# Patient Record
Sex: Female | Born: 1990 | Race: Black or African American | Hispanic: No | Marital: Single | Smoking: Current every day smoker
Health system: Southern US, Community
[De-identification: ages and names within clinical notes are randomized; demographics above are authoritative.]

## PROBLEM LIST (undated history)

## (undated) HISTORY — PX: TONSILLECTOMY: SUR1361

---
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
## 2011-08-08 ENCOUNTER — Emergency Department: Payer: Self-pay | Admitting: Unknown Physician Specialty

## 2011-09-25 ENCOUNTER — Emergency Department: Payer: Self-pay | Admitting: Emergency Medicine

## 2011-12-06 ENCOUNTER — Observation Stay: Payer: Self-pay

## 2012-02-11 ENCOUNTER — Observation Stay: Payer: Self-pay | Admitting: Emergency Medicine

## 2012-02-22 ENCOUNTER — Inpatient Hospital Stay: Payer: Self-pay | Admitting: Obstetrics and Gynecology

## 2012-02-22 LAB — CBC WITH DIFFERENTIAL/PLATELET
Basophil #: 0 10*3/uL (ref 0.0–0.1)
Basophil %: 0 %
Eosinophil #: 0 10*3/uL (ref 0.0–0.7)
Eosinophil %: 0.3 %
HGB: 11.6 g/dL — ABNORMAL LOW (ref 12.0–16.0)
Lymphocyte #: 2 10*3/uL (ref 1.0–3.6)
Lymphocyte %: 18.8 %
MCHC: 33.2 g/dL (ref 32.0–36.0)
Monocyte %: 5.5 %
Neutrophil #: 8.1 10*3/uL — ABNORMAL HIGH (ref 1.4–6.5)
Neutrophil %: 75.4 %
Platelet: 102 10*3/uL — ABNORMAL LOW (ref 150–440)
RBC: 3.87 10*6/uL (ref 3.80–5.20)

## 2012-02-23 LAB — PIH PROFILE
Anion Gap: 13 (ref 7–16)
BUN: 4 mg/dL — ABNORMAL LOW (ref 7–18)
Calcium, Total: 9.1 mg/dL (ref 9.0–10.7)
Chloride: 106 mmol/L (ref 98–107)
EGFR (African American): >60
Glucose: 122 mg/dL — ABNORMAL HIGH (ref 65–99)
HCT: 35.5 % (ref 35.0–47.0)
HGB: 11.7 g/dL — ABNORMAL LOW (ref 12.0–16.0)
MCH: 30.1 pg (ref 26.0–34.0)
Osmolality: 285 (ref 275–301)
Platelet: 104 10*3/uL — ABNORMAL LOW (ref 150–440)
Potassium: 3.7 mmol/L (ref 3.5–5.1)
RBC: 3.9 10*6/uL (ref 3.80–5.20)
Sodium: 144 mmol/L (ref 136–145)
Uric Acid: 5.4 mg/dL (ref 3.0–5.8)
WBC: 17.3 10*3/uL — ABNORMAL HIGH (ref 3.6–11.0)

## 2012-02-23 LAB — CBC WITH DIFFERENTIAL/PLATELET
Basophil #: 0 10*3/uL (ref 0.0–0.1)
Eosinophil #: 0 10*3/uL (ref 0.0–0.7)
Lymphocyte #: 1.6 10*3/uL (ref 1.0–3.6)
MCH: 29.7 pg (ref 26.0–34.0)
MCHC: 32.9 g/dL (ref 32.0–36.0)
MCV: 90 fL (ref 80–100)
Monocyte #: 1.1 10*3/uL — ABNORMAL HIGH (ref 0.0–0.7)
Platelet: 105 10*3/uL — ABNORMAL LOW (ref 150–440)
RDW: 14.2 % (ref 11.5–14.5)
WBC: 19.8 10*3/uL — ABNORMAL HIGH (ref 3.6–11.0)

## 2012-02-24 LAB — HEMATOCRIT: HCT: 33.3 % — ABNORMAL LOW (ref 35.0–47.0)

## 2013-05-13 ENCOUNTER — Emergency Department: Payer: Self-pay | Admitting: Emergency Medicine

## 2014-08-19 ENCOUNTER — Emergency Department: Payer: Self-pay | Admitting: Emergency Medicine

## 2014-08-19 LAB — CBC WITH DIFFERENTIAL/PLATELET
BASOS ABS: 0.1 10*3/uL (ref 0.0–0.1)
Basophil %: 0.7 %
Eosinophil #: 0.2 10*3/uL (ref 0.0–0.7)
Eosinophil %: 1.6 %
HCT: 41.8 % (ref 35.0–47.0)
HGB: 13.5 g/dL (ref 12.0–16.0)
LYMPHS ABS: 1.9 10*3/uL (ref 1.0–3.6)
LYMPHS PCT: 15.3 %
MCH: 30.1 pg (ref 26.0–34.0)
MCHC: 32.4 g/dL (ref 32.0–36.0)
MCV: 93 fL (ref 80–100)
MONO ABS: 0.7 x10 3/mm (ref 0.2–0.9)
MONOS PCT: 5.7 %
NEUTROS PCT: 76.7 %
Neutrophil #: 9.4 10*3/uL — ABNORMAL HIGH (ref 1.4–6.5)
PLATELETS: 299 10*3/uL (ref 150–440)
RBC: 4.5 10*6/uL (ref 3.80–5.20)
RDW: 14.3 % (ref 11.5–14.5)
WBC: 12.2 10*3/uL — ABNORMAL HIGH (ref 3.6–11.0)

## 2014-08-19 LAB — COMPREHENSIVE METABOLIC PANEL
ALBUMIN: 3.1 g/dL — AB (ref 3.4–5.0)
Alkaline Phosphatase: 72 U/L
Anion Gap: 5 — ABNORMAL LOW (ref 7–16)
BILIRUBIN TOTAL: 0.7 mg/dL (ref 0.2–1.0)
BUN: 6 mg/dL — AB (ref 7–18)
CHLORIDE: 105 mmol/L (ref 98–107)
CREATININE: 1.12 mg/dL (ref 0.60–1.30)
Calcium, Total: 9.1 mg/dL (ref 8.5–10.1)
Co2: 27 mmol/L (ref 21–32)
EGFR (African American): >60
EGFR (Non-African Amer.): >60
Glucose: 95 mg/dL (ref 65–99)
OSMOLALITY: 271 (ref 275–301)
Potassium: 3.2 mmol/L — ABNORMAL LOW (ref 3.5–5.1)
SGOT(AST): 17 U/L (ref 15–37)
SGPT (ALT): 9 U/L — ABNORMAL LOW
Sodium: 137 mmol/L (ref 136–145)
Total Protein: 7.8 g/dL (ref 6.4–8.2)

## 2014-08-19 LAB — URINALYSIS, COMPLETE
Bilirubin,UR: NEGATIVE
Glucose,UR: NEGATIVE mg/dL (ref 0–75)
NITRITE: NEGATIVE
Ph: 6 (ref 4.5–8.0)
RBC,UR: 277 /HPF (ref 0–5)
Specific Gravity: 1.024 (ref 1.003–1.030)
Squamous Epithelial: 48
WBC UR: 152 /HPF (ref 0–5)

## 2014-08-19 LAB — LIPASE, BLOOD: Lipase: 111 U/L (ref 73–393)
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
## 2015-07-12 ENCOUNTER — Emergency Department
Admission: EM | Admit: 2015-07-12 | Discharge: 2015-07-12 | Disposition: A | Discharge status: Home / Self Care | Payer: Medicaid Other | Attending: Emergency Medicine | Admitting: Emergency Medicine

## 2015-07-12 ENCOUNTER — Encounter: Payer: Self-pay | Admitting: Emergency Medicine

## 2015-07-12 DIAGNOSIS — R358 Other polyuria: Secondary | ICD-10-CM | POA: Insufficient documentation

## 2015-07-12 DIAGNOSIS — Z3202 Encounter for pregnancy test, result negative: Secondary | ICD-10-CM | POA: Insufficient documentation

## 2015-07-12 DIAGNOSIS — N898 Other specified noninflammatory disorders of vagina: Secondary | ICD-10-CM | POA: Diagnosis not present

## 2015-07-12 DIAGNOSIS — Z720 Tobacco use: Secondary | ICD-10-CM | POA: Insufficient documentation

## 2015-07-12 DIAGNOSIS — Z880 Allergy status to penicillin: Secondary | ICD-10-CM | POA: Insufficient documentation

## 2015-07-12 DIAGNOSIS — R102 Pelvic and perineal pain: Secondary | ICD-10-CM | POA: Diagnosis not present

## 2015-07-12 LAB — WET PREP, GENITAL
Trich, Wet Prep: NONE SEEN
Yeast Wet Prep HPF POC: NONE SEEN

## 2015-07-12 LAB — URINALYSIS COMPLETE WITH MICROSCOPIC (ARMC ONLY)
BACTERIA UA: NONE SEEN
BILIRUBIN URINE: NEGATIVE
Glucose, UA: NEGATIVE mg/dL
HGB URINE DIPSTICK: NEGATIVE
Ketones, ur: NEGATIVE mg/dL
Leukocytes, UA: NEGATIVE
Nitrite: NEGATIVE
PH: 8 (ref 5.0–8.0)
PROTEIN: NEGATIVE mg/dL
Specific Gravity, Urine: 1.018 (ref 1.005–1.030)

## 2015-07-12 LAB — POCT PREGNANCY, URINE: Preg Test, Ur: NEGATIVE

## 2015-07-12 LAB — PREGNANCY, URINE: Preg Test, Ur: NEGATIVE

## 2015-07-12 LAB — CHLAMYDIA/NGC RT PCR (ARMC ONLY)
Chlamydia Tr: NOT DETECTED
N gonorrhoeae: DETECTED — AB

## 2015-07-12 MED ORDER — LIDOCAINE HCL (PF) 1 % IJ SOLN
0.9000 mL | Freq: Once | INTRAMUSCULAR | Status: AC
  Administered 2015-07-12: 0.9 mL

## 2015-07-12 MED ORDER — AZITHROMYCIN 250 MG PO TABS
ORAL_TABLET | ORAL | Status: AC
  Administered 2015-07-12: 1000 mg via ORAL
  Filled 2015-07-12: qty 4

## 2015-07-12 MED ORDER — CEFTRIAXONE SODIUM 250 MG IJ SOLR
INTRAMUSCULAR | Status: AC
  Administered 2015-07-12: 250 mg via INTRAMUSCULAR
  Filled 2015-07-12: qty 250

## 2015-07-12 MED ORDER — LIDOCAINE HCL (PF) 1 % IJ SOLN
INTRAMUSCULAR | Status: AC
  Administered 2015-07-12: 0.9 mL
  Filled 2015-07-12: qty 5

## 2015-07-12 MED ORDER — AZITHROMYCIN 250 MG PO TABS
1000.0000 mg | ORAL_TABLET | Freq: Once | ORAL | Status: AC
  Administered 2015-07-12: 1000 mg via ORAL

## 2015-07-12 MED ORDER — CEFTRIAXONE SODIUM 250 MG IJ SOLR
250.0000 mg | INTRAMUSCULAR | Status: DC
  Administered 2015-07-12: 250 mg via INTRAMUSCULAR
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
## 2015-07-12 NOTE — ED Notes (Addendum)
POC preg negative.

## 2015-07-12 NOTE — ED Notes (Signed)
Pelvic pain and urinary frequency. Recently dx with uti but states she didn't get her antibiotic filled bc she didn't have money ".  States "now its worse"
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
## 2016-04-29 ENCOUNTER — Emergency Department
Admission: EM | Admit: 2016-04-29 | Discharge: 2016-04-29 | Disposition: A | Discharge status: Home / Self Care | Payer: Medicaid Other | Attending: Emergency Medicine | Admitting: Emergency Medicine

## 2016-04-29 ENCOUNTER — Encounter: Payer: Self-pay | Admitting: Emergency Medicine

## 2016-04-29 DIAGNOSIS — N939 Abnormal uterine and vaginal bleeding, unspecified: Secondary | ICD-10-CM | POA: Diagnosis present

## 2016-04-29 DIAGNOSIS — F17210 Nicotine dependence, cigarettes, uncomplicated: Secondary | ICD-10-CM | POA: Diagnosis not present

## 2016-04-29 LAB — HCG, QUANTITATIVE, PREGNANCY: hCG, Beta Chain, Quant, S: <1 m[IU]/mL (ref <5)

## 2016-04-29 LAB — POCT PREGNANCY, URINE: PREG TEST UR: NEGATIVE

## 2016-04-29 NOTE — ED Notes (Addendum)
Pt to ed with c/o LMP on March 15th.,  Had positive pregnancy test and then started with vaginal bleeding on April 25th and very light bleeding now.  Pt states she was trying to get pregnant.  Pt reports abd pain and cramping with the vaginal bleeding.

## 2016-04-29 NOTE — ED Notes (Signed)
Pt had a positive pregnancy test 2 weeks ago, pt started having vaginal bleeding with cramping two days ago, pt is currently spotting blood today

## 2016-04-29 NOTE — Discharge Instructions (Signed)
As we discussed, your pregnancy tests (both urine and blood) are negative today.  We do not know if your pregnancy was just so early that you had a spontaneous miscarriage before the blood test could turn positive, or perhaps you had a false positive pregnancy test at home.  Either way, there is no other treatment needed at this time.  We recommend you follow-up as scheduled tomorrow since you declined a pelvic exam today; you should have STD testing either with [facility] or [facility] at the next available opportunity.
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
## 2016-08-11 ENCOUNTER — Emergency Department: Payer: Medicaid Other

## 2016-08-11 ENCOUNTER — Encounter: Payer: Self-pay | Admitting: *Deleted

## 2016-08-11 ENCOUNTER — Emergency Department
Admission: EM | Admit: 2016-08-11 | Discharge: 2016-08-11 | Disposition: A | Discharge status: Home / Self Care | Payer: Medicaid Other | Attending: Emergency Medicine | Admitting: Emergency Medicine

## 2016-08-11 DIAGNOSIS — Y999 Unspecified external cause status: Secondary | ICD-10-CM | POA: Diagnosis not present

## 2016-08-11 DIAGNOSIS — Y939 Activity, unspecified: Secondary | ICD-10-CM | POA: Diagnosis not present

## 2016-08-11 DIAGNOSIS — R51 Headache: Secondary | ICD-10-CM | POA: Diagnosis present

## 2016-08-11 DIAGNOSIS — F17210 Nicotine dependence, cigarettes, uncomplicated: Secondary | ICD-10-CM | POA: Diagnosis not present

## 2016-08-11 DIAGNOSIS — Y92410 Unspecified street and highway as the place of occurrence of the external cause: Secondary | ICD-10-CM | POA: Diagnosis not present

## 2016-08-11 DIAGNOSIS — S0083XA Contusion of other part of head, initial encounter: Secondary | ICD-10-CM | POA: Diagnosis not present

## 2016-08-11 LAB — POCT PREGNANCY, URINE: PREG TEST UR: NEGATIVE

## 2016-08-11 MED ORDER — CYCLOBENZAPRINE HCL 10 MG PO TABS: 10.0000 mg | ORAL_TABLET | Freq: Three times a day (TID) | ORAL | 0 refills | Status: DC | PRN

## 2016-08-11 MED ORDER — IBUPROFEN 800 MG PO TABS: 800.0000 mg | ORAL_TABLET | Freq: Three times a day (TID) | ORAL | 0 refills | Status: DC | PRN
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
## 2017-01-06 LAB — HM HIV SCREENING LAB: HM HIV Screening: NEGATIVE

## 2017-10-01 ENCOUNTER — Encounter: Payer: Self-pay | Admitting: Emergency Medicine

## 2017-10-01 ENCOUNTER — Emergency Department
Admission: EM | Admit: 2017-10-01 | Discharge: 2017-10-01 | Disposition: A | Discharge status: Home / Self Care | Payer: No Typology Code available for payment source | Attending: Emergency Medicine | Admitting: Emergency Medicine

## 2017-10-01 DIAGNOSIS — Y999 Unspecified external cause status: Secondary | ICD-10-CM | POA: Insufficient documentation

## 2017-10-01 DIAGNOSIS — F17210 Nicotine dependence, cigarettes, uncomplicated: Secondary | ICD-10-CM | POA: Insufficient documentation

## 2017-10-01 DIAGNOSIS — Y92410 Unspecified street and highway as the place of occurrence of the external cause: Secondary | ICD-10-CM | POA: Diagnosis not present

## 2017-10-01 DIAGNOSIS — M542 Cervicalgia: Secondary | ICD-10-CM | POA: Diagnosis not present

## 2017-10-01 DIAGNOSIS — Y9389 Activity, other specified: Secondary | ICD-10-CM | POA: Diagnosis not present

## 2017-10-01 LAB — POCT PREGNANCY, URINE: Preg Test, Ur: NEGATIVE

## 2017-10-01 MED ORDER — NAPROXEN 500 MG PO TABS
500.0000 mg | ORAL_TABLET | Freq: Once | ORAL | Status: AC
  Administered 2017-10-01: 500 mg via ORAL
  Filled 2017-10-01: qty 1

## 2017-10-01 MED ORDER — CYCLOBENZAPRINE HCL 10 MG PO TABS
5.0000 mg | ORAL_TABLET | Freq: Once | ORAL | Status: AC
  Administered 2017-10-01: 5 mg via ORAL
  Filled 2017-10-01: qty 1

## 2017-10-01 MED ORDER — CYCLOBENZAPRINE HCL 5 MG PO TABS: 5.0000 mg | ORAL_TABLET | Freq: Three times a day (TID) | ORAL | 0 refills | Status: DC | PRN

## 2017-10-01 NOTE — Discharge Instructions (Signed)
Your exam is consistent with neck muscle strain. Take the prescription muscle relaxant as needed. Take OTC ibuprofen as needed for non-drowsy pain relief. Follow-up with your provider for continued symptoms.
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
## 2017-10-24 ENCOUNTER — Encounter: Payer: Self-pay | Admitting: Emergency Medicine

## 2017-10-24 ENCOUNTER — Emergency Department: Payer: Medicaid Other

## 2017-10-24 ENCOUNTER — Emergency Department
Admission: EM | Admit: 2017-10-24 | Discharge: 2017-10-24 | Disposition: A | Discharge status: Home / Self Care | Payer: Medicaid Other | Attending: Emergency Medicine | Admitting: Emergency Medicine

## 2017-10-24 DIAGNOSIS — N7011 Chronic salpingitis: Secondary | ICD-10-CM | POA: Insufficient documentation

## 2017-10-24 DIAGNOSIS — N739 Female pelvic inflammatory disease, unspecified: Secondary | ICD-10-CM

## 2017-10-24 DIAGNOSIS — F17210 Nicotine dependence, cigarettes, uncomplicated: Secondary | ICD-10-CM | POA: Insufficient documentation

## 2017-10-24 DIAGNOSIS — R1030 Lower abdominal pain, unspecified: Secondary | ICD-10-CM | POA: Diagnosis present

## 2017-10-24 LAB — COMPREHENSIVE METABOLIC PANEL
ALK PHOS: 62 U/L (ref 38–126)
ALT: 14 U/L (ref 14–54)
AST: 20 U/L (ref 15–41)
Albumin: 4.3 g/dL (ref 3.5–5.0)
Anion gap: 9 (ref 5–15)
BUN: 10 mg/dL (ref 6–20)
CALCIUM: 9.5 mg/dL (ref 8.9–10.3)
CO2: 26 mmol/L (ref 22–32)
CREATININE: 1.1 mg/dL — AB (ref 0.44–1.00)
Chloride: 102 mmol/L (ref 101–111)
Glucose, Bld: 113 mg/dL — ABNORMAL HIGH (ref 65–99)
Potassium: 4 mmol/L (ref 3.5–5.1)
Sodium: 137 mmol/L (ref 135–145)
TOTAL PROTEIN: 8 g/dL (ref 6.5–8.1)
Total Bilirubin: 0.8 mg/dL (ref 0.3–1.2)

## 2017-10-24 LAB — CHLAMYDIA/NGC RT PCR (ARMC ONLY)
CHLAMYDIA TR: NOT DETECTED
N GONORRHOEAE: DETECTED — AB

## 2017-10-24 LAB — CBC
HCT: 41.8 % (ref 35.0–47.0)
HEMOGLOBIN: 13.7 g/dL (ref 12.0–16.0)
MCH: 30.3 pg (ref 26.0–34.0)
MCHC: 32.9 g/dL (ref 32.0–36.0)
MCV: 92.1 fL (ref 80.0–100.0)
Platelets: 190 10*3/uL (ref 150–440)
RBC: 4.53 MIL/uL (ref 3.80–5.20)
RDW: 14.7 % — ABNORMAL HIGH (ref 11.5–14.5)
WBC: 18.4 10*3/uL — ABNORMAL HIGH (ref 3.6–11.0)

## 2017-10-24 LAB — URINALYSIS, COMPLETE (UACMP) WITH MICROSCOPIC
BILIRUBIN URINE: NEGATIVE
Bacteria, UA: NONE SEEN
Glucose, UA: NEGATIVE mg/dL
HGB URINE DIPSTICK: NEGATIVE
Ketones, ur: NEGATIVE mg/dL
Leukocytes, UA: NEGATIVE
NITRITE: NEGATIVE
PROTEIN: NEGATIVE mg/dL
Specific Gravity, Urine: 1.02 (ref 1.005–1.030)
pH: 7 (ref 5.0–8.0)

## 2017-10-24 LAB — LIPASE, BLOOD: Lipase: 19 U/L (ref 11–51)

## 2017-10-24 LAB — WET PREP, GENITAL
Clue Cells Wet Prep HPF POC: NONE SEEN
Sperm: NONE SEEN
Trich, Wet Prep: NONE SEEN
Yeast Wet Prep HPF POC: NONE SEEN

## 2017-10-24 LAB — POCT PREGNANCY, URINE: PREG TEST UR: NEGATIVE

## 2017-10-24 MED ORDER — MORPHINE SULFATE (PF) 4 MG/ML IV SOLN
INTRAVENOUS | Status: AC
  Administered 2017-10-24: 4 mg via INTRAVENOUS
  Filled 2017-10-24: qty 1

## 2017-10-24 MED ORDER — SODIUM CHLORIDE 0.9 % IV BOLUS (SEPSIS)
1000.0000 mL | Freq: Once | INTRAVENOUS | Status: AC
  Administered 2017-10-24: 1000 mL via INTRAVENOUS

## 2017-10-24 MED ORDER — LEVOFLOXACIN 500 MG PO TABS
500.0000 mg | ORAL_TABLET | Freq: Once | ORAL | Status: AC
  Administered 2017-10-24: 500 mg via ORAL
  Filled 2017-10-24: qty 1

## 2017-10-24 MED ORDER — METRONIDAZOLE 500 MG PO TABS
500.0000 mg | ORAL_TABLET | Freq: Once | ORAL | Status: AC
  Administered 2017-10-24: 500 mg via ORAL
  Filled 2017-10-24: qty 1

## 2017-10-24 MED ORDER — IOPAMIDOL (ISOVUE-300) INJECTION 61%
30.0000 mL | Freq: Once | INTRAVENOUS | Status: AC | PRN
  Administered 2017-10-24: 30 mL via ORAL

## 2017-10-24 MED ORDER — LEVOFLOXACIN 500 MG PO TABS: 500.0000 mg | ORAL_TABLET | Freq: Every day | ORAL | 0 refills | Status: AC

## 2017-10-24 MED ORDER — MORPHINE SULFATE (PF) 4 MG/ML IV SOLN
4.0000 mg | Freq: Once | INTRAVENOUS | Status: AC
  Administered 2017-10-24: 4 mg via INTRAVENOUS

## 2017-10-24 MED ORDER — METRONIDAZOLE 500 MG PO TABS: 500.0000 mg | ORAL_TABLET | Freq: Two times a day (BID) | ORAL | 0 refills | Status: AC

## 2017-10-24 MED ORDER — ONDANSETRON HCL 4 MG/2ML IJ SOLN
INTRAMUSCULAR | Status: AC
  Administered 2017-10-24: 4 mg via INTRAVENOUS
  Filled 2017-10-24: qty 2

## 2017-10-24 MED ORDER — ONDANSETRON HCL 4 MG/2ML IJ SOLN
4.0000 mg | Freq: Once | INTRAMUSCULAR | Status: AC
  Administered 2017-10-24: 4 mg via INTRAVENOUS

## 2017-10-24 MED ORDER — IOPAMIDOL (ISOVUE-300) INJECTION 61%
100.0000 mL | Freq: Once | INTRAVENOUS | Status: AC | PRN
  Administered 2017-10-24: 100 mL via INTRAVENOUS

## 2017-10-24 NOTE — ED Triage Notes (Signed)
Pt c/o mid lower abdominal pain that radiates to right side of abdomen. Started last night. No vomiting but has been nauseated.  Did have temp 101 per pt but went away on own.  VSS.  No distress currently.  Ambulatory to triage.

## 2017-10-24 NOTE — ED Notes (Signed)
Pt. Indicated she finished contrast, ct called.

## 2017-10-24 NOTE — ED Provider Notes (Signed)
[facility] Emergency Department Provider Note ____________________________________________   First MD Initiated Contact with Patient 10/24/17 1839     (approximate)  I have reviewed the triage vital signs and the nursing notes.   HISTORY  Chief Complaint Abdominal Pain    HPI Melanie Duran is a 26 y.o. female with no significant past medical history who presents with right lower abdominal pain since last night, acute onset, persistent course, associated with nausea but no vomiting, as well as fever.  No associated diarrhea.  Patient reports last bowel movement earlier today which was normal.  She denies vaginal bleeding or discharge, and states she is sexually active with one partner and has no STI history.   History reviewed. No pertinent past medical history.  There are no active problems to display for this patient.   Past Surgical History:  Procedure Laterality Date  . TONSILLECTOMY      Prior to Admission medications   Medication Sig Start Date End Date Taking? Authorizing Provider  cyclobenzaprine (FLEXERIL) 5 MG tablet Take 1 tablet (5 mg total) by mouth 3 (three) times daily as needed for muscle spasms. 10/01/17   Menshew, Charlesetta Ivory, PA-C  ibuprofen (ADVIL,MOTRIN) 800 MG tablet Take 1 tablet (800 mg total) by mouth every 8 (eight) hours as needed. 08/11/16   Beers, Charmayne Sheer, PA-C    Allergies Amoxicillin  History reviewed. No pertinent family history.  Social History Social History  Substance Use Topics  . Smoking status: Current Every Day Smoker    Packs/day: 0.50    Types: Cigarettes  . Smokeless tobacco: Never Used  . Alcohol use Yes    Review of Systems  Constitutional: Positive for fever Eyes: No redness. ENT: No neck pain. Cardiovascular: Denies chest pain. Respiratory: Denies shortness of breath. Gastrointestinal: Positive for nausea Genitourinary: Negative for dysuria.  Musculoskeletal: Negative for back  pain. Skin: Negative for rash. Neurological: Negative for headache.    ____________________________________________   PHYSICAL EXAM:  VITAL SIGNS: ED Triage Vitals  Enc Vitals Group     BP 10/24/17 1551 125/61     Pulse Rate 10/24/17 1551 80     Resp 10/24/17 1551 18     Temp 10/24/17 1551 99.2 F (37.3 C)     Temp Source 10/24/17 1551 Oral     SpO2 10/24/17 1551 100 %     Weight 10/24/17 1552 175 lb (79.4 kg)     Height 10/24/17 1552 5\' 3"  (1.6 m)     Head Circumference --      Peak Flow --      Pain Score 10/24/17 1551 10     Pain Loc --      Pain Edu? --      Excl. in GC? --     Constitutional: Alert and oriented. Well appearing and in no acute distress. Eyes: Conjunctivae are normal.  Head: Atraumatic. Nose: No congestion/rhinnorhea. Mouth/Throat: Mucous membranes are moist.   Neck: Normal range of motion.  Cardiovascular:   Good peripheral circulation. Respiratory: Normal respiratory effort.   Gastrointestinal: Soft with mild midline suprapubic and moderate right lower quadrant tenderness to palpation.  No distention. Genitourinary: No CVA tenderness. Musculoskeletal: No lower extremity edema.  Extremities warm and well perfused.  Neurologic:  Normal speech and language. No gross focal neurologic deficits are appreciated.  Skin:  Skin is warm and dry. No rash noted. Psychiatric: Mood and affect are normal. Speech and behavior are normal.  ____________________________________________   LABS (all  labs ordered are listed, but only abnormal results are displayed)  Labs Reviewed  WET PREP, GENITAL - Abnormal; Notable for the following:       Result Value   WBC, Wet Prep HPF POC MANY (*)    All other components within normal limits  COMPREHENSIVE METABOLIC PANEL - Abnormal; Notable for the following:    Glucose, Bld 113 (*)    Creatinine, Ser 1.10 (*)    All other components within normal limits  CBC - Abnormal; Notable for the following:    WBC 18.4 (*)     RDW 14.7 (*)    All other components within normal limits  URINALYSIS, COMPLETE (UACMP) WITH MICROSCOPIC - Abnormal; Notable for the following:    Color, Urine YELLOW (*)    APPearance CLEAR (*)    Squamous Epithelial / LPF 6-30 (*)    All other components within normal limits  CHLAMYDIA/NGC RT PCR ([facility] ONLY)  LIPASE, BLOOD  POC URINE PREG, ED  POCT PREGNANCY, URINE   ____________________________________________  EKG   ____________________________________________  RADIOLOGY  CT abd: R large and L small hydro/pylosalpinx, consistent with PID.  Normal appendix.   ____________________________________________   PROCEDURES  Procedure(s) performed: No    Critical Care performed: No ____________________________________________   INITIAL IMPRESSION / ASSESSMENT AND PLAN / ED COURSE  Pertinent labs & imaging results that were available during my care of the patient were reviewed by me and considered in my medical decision making (see chart for details).  26-year-old female with no significant past medical history presents with 1 day of right lower quadrant abdominal pain associated with nausea and fever.  On exam, patient's temperature is slightly elevated, and she is tender in the right lower quadrant.  Of exam deferred due to lack of gynecologic symptoms and presence of fever and white blood cell count.  Differential includes appendicitis, colitis, mesenteric adenitis, less likely acute gynecologic cause.  Given patient's well appearance and stable vital signs do not suspect torsion.  Plan: CT abdomen.  If no acute findings, consider gynecologic workup with ultrasound.    ----------------------------------------- 9:07 PM on 10/24/2017 -----------------------------------------  CT reveals bilateral right greater than left hydro-/pyosalpinx, and findings consistent with PID.  Pelvic performed which confirms small amount of discharge but no other acute findings.  I  consulted Dr. Elesa MassedWard from OB/GYN to discuss management.  Based on patient's normal vital signs, well appearance, the CT findings, and the fact she is not pregnant, per Dr. Elesa MassedWard, patient is appropriate for outpatient management.  Recommends outpatient treatment for PID and follow-up in 2 weeks for ultrasound and reassessment.  Patient feels comfortable with the plan and would like to go home.  I gave return precautions including but not limited to new or worsening pain, persistent pain, or persistent fever despite antibiotics.  Patient also instructed to have her sexual partner tested.   She reports an amoxicillin allergy which is consistent with anaphylaxis ("my throat closes"), so she is not appropriate for therapy with ceftriaxone.  Based on CDC recommendations for alternative regimens, we will discharge with levofloxacin and Flagyl and give first dose here.    ____________________________________________   FINAL CLINICAL IMPRESSION(S) / ED DIAGNOSES  Final diagnoses:  Pelvic inflammatory disease  Hydrosalpinx      NEW MEDICATIONS STARTED DURING THIS VISIT:  New Prescriptions   No medications on file     Note:  This document was prepared using Dragon voice recognition software and may include unintentional dictation errors.  Dionne Bucy, MD 10/24/17 2111

## 2017-10-24 NOTE — ED Notes (Signed)
Pt going home with family

## 2017-10-24 NOTE — ED Notes (Signed)
Patient transported to CT 

## 2017-10-24 NOTE — Discharge Instructions (Signed)
Take the antibiotics as prescribed starting tomorrow and finish the full course.  Follow-up with Dr. Elesa MassedWard in 2 weeks from Monday, and when you call to arrange the appointment you need to specifically state that you will need an ultrasound at that appointment.  In the meantime, return to the ER immediately if you have new or worsening pain, persistent pain, or persistent fever or weakness despite being on antibiotics, as well as for any other new or worsening symptoms that concern you.

## 2017-10-26 ENCOUNTER — Telehealth: Payer: Self-pay | Admitting: Emergency Medicine

## 2017-10-26 NOTE — Telephone Encounter (Addendum)
Called patient to notify of lab results and check on condition.  Voicemail is not available and she did not answer.  4pm--still unable to contact by phone.  Will send letter.   11/5-finally got in touch with patient.  She says she is doing well.  Explained results of std testing.  Has not followed up with dr ward yet, but will call for appt now.  Says partner was treated as well.

## 2018-04-02 ENCOUNTER — Other Ambulatory Visit: Payer: Self-pay

## 2018-04-02 ENCOUNTER — Telehealth (HOSPITAL_COMMUNITY): Payer: Self-pay

## 2018-04-02 ENCOUNTER — Ambulatory Visit
Admission: EM | Admit: 2018-04-02 | Discharge: 2018-04-02 | Disposition: A | Discharge status: Home / Self Care | Payer: Medicaid Other | Attending: Family Medicine | Admitting: Family Medicine

## 2018-04-02 DIAGNOSIS — F17210 Nicotine dependence, cigarettes, uncomplicated: Secondary | ICD-10-CM | POA: Diagnosis not present

## 2018-04-02 DIAGNOSIS — M545 Low back pain: Secondary | ICD-10-CM | POA: Diagnosis present

## 2018-04-02 DIAGNOSIS — A599 Trichomoniasis, unspecified: Secondary | ICD-10-CM | POA: Diagnosis not present

## 2018-04-02 DIAGNOSIS — N730 Acute parametritis and pelvic cellulitis: Secondary | ICD-10-CM | POA: Diagnosis not present

## 2018-04-02 DIAGNOSIS — R1030 Lower abdominal pain, unspecified: Secondary | ICD-10-CM | POA: Diagnosis present

## 2018-04-02 LAB — URINALYSIS, COMPLETE (UACMP) WITH MICROSCOPIC
Bacteria, UA: NONE SEEN
GLUCOSE, UA: NEGATIVE mg/dL
Hgb urine dipstick: NEGATIVE
KETONES UR: NEGATIVE mg/dL
Leukocytes, UA: NEGATIVE
NITRITE: NEGATIVE
PH: 6 (ref 5.0–8.0)
RBC / HPF: NONE SEEN RBC/hpf (ref 0–5)

## 2018-04-02 LAB — CHLAMYDIA/NGC RT PCR (ARMC ONLY)
CHLAMYDIA TR: NOT DETECTED
N GONORRHOEAE: NOT DETECTED

## 2018-04-02 LAB — WET PREP, GENITAL
CLUE CELLS WET PREP: NONE SEEN
Sperm: NONE SEEN
YEAST WET PREP: NONE SEEN

## 2018-04-02 LAB — PREGNANCY, URINE: Preg Test, Ur: NEGATIVE

## 2018-04-02 MED ORDER — LEVOFLOXACIN 500 MG PO TABS: 500.0000 mg | ORAL_TABLET | Freq: Every day | ORAL | 0 refills | Status: DC

## 2018-04-02 MED ORDER — METRONIDAZOLE 500 MG PO TABS: 2000.0000 mg | ORAL_TABLET | Freq: Once | ORAL | 0 refills | Status: AC

## 2018-04-02 MED ORDER — AZITHROMYCIN 250 MG PO TABS: 2000.0000 mg | ORAL_TABLET | Freq: Once | ORAL | 0 refills | Status: AC

## 2018-04-02 MED ORDER — AZITHROMYCIN 500 MG PO TABS: 2000.0000 mg | ORAL_TABLET | Freq: Once | ORAL | Status: DC

## 2018-04-02 NOTE — Telephone Encounter (Signed)
Attempted to contact patient regarding results from recent visit and to advise to stop taking Levaquin and Azithromycin. No answer at this time. Voicemail left.

## 2018-04-02 NOTE — Discharge Instructions (Addendum)
Medications as prescribed. ° °Take care ° °Dr. Francille Wittmann  °
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
## 2018-04-02 NOTE — ED Provider Notes (Signed)
[facility]   CSN: [id number] Arrival date & time: 04/02/18  0808  History   Chief Complaint Chief Complaint  Patient presents with  . Flank Pain   HPI  27 year old female presents with reports of back pain and lower abdominal pain.  Patient reports she has had a 2-week history of bilateral lower abdominal pain and bilateral low back pain.  She reports that she is concerned that she may have a "kidney infection".  She has had no fever.  No chills.  No nausea vomiting.  No dysuria.  She does report that she urinates frequently.  However, she drinks a lot of water.  She is recently had unprotected intercourse.  She states that  her significant other cheated on her and she is concerned about STDs.  She has a history of PID.  No other associated symptoms.  No other complaints or concerns at this time.  PMH - History of PID.  Past Surgical History:  Procedure Laterality Date  . TONSILLECTOMY     OB History    Gravida  3   Para      Term      Preterm      AB      Living  1     SAB      TAB      Ectopic      Multiple      Live Births             Home Medications    Prior to Admission medications   Medication Sig Start Date End Date Taking? Authorizing Provider  azithromycin (ZITHROMAX) 250 MG tablet Take 8 tablets (2,000 mg total) by mouth once for 1 dose. 04/02/18 04/02/18  Tommie Sams, DO  levofloxacin (LEVAQUIN) 500 MG tablet Take 1 tablet (500 mg total) by mouth daily. 04/02/18   Tommie Sams, DO  metroNIDAZOLE (FLAGYL) 500 MG tablet Take 4 tablets (2,000 mg total) by mouth once for 1 dose. 04/02/18 04/02/18  Tommie Sams, DO   Family History No reported family hx.  Social History Social History   Tobacco Use  . Smoking status: Current Every Day Smoker    Packs/day: 0.50    Types: Cigarettes  . Smokeless tobacco: Never Used  Substance Use Topics  . Alcohol use: Not Currently  . Drug use: No   Allergies   Amoxicillin  Review of  Systems Review of Systems  Constitutional: Negative for fever.  Gastrointestinal: Positive for abdominal pain. Negative for nausea and vomiting.  Genitourinary: Negative for dysuria.       Patient reports normal vaginal discharge.   Physical Exam Triage Vital Signs ED Triage Vitals  Enc Vitals Group     BP 04/02/18 0823 114/79     Pulse Rate 04/02/18 0823 67     Resp 04/02/18 0823 18     Temp 04/02/18 0823 97.8 F (36.6 C)     Temp Source 04/02/18 0823 Oral     SpO2 04/02/18 0823 100 %     Weight 04/02/18 0823 165 lb (74.8 kg)     Height 04/02/18 0823 5\' 3"  (1.6 m)     Head Circumference --      Peak Flow --      Pain Score 04/02/18 0822 8     Pain Loc --      Pain Edu? --      Excl. in GC? --    Updated Vital Signs BP 114/79 (BP  Location: Left Arm)   Pulse 67   Temp 97.8 F (36.6 C) (Oral)   Resp 18   Ht 5\' 3"  (1.6 m)   Wt 165 lb (74.8 kg)   LMP 03/26/2018   SpO2 100%   Breastfeeding? No   BMI 29.23 kg/m    Physical Exam  Constitutional: She is oriented to person, place, and time. She appears well-developed. No distress.  HENT:  Head: Normocephalic and atraumatic.  Eyes: Conjunctivae are normal. Right eye exhibits no discharge. Left eye exhibits no discharge.  Cardiovascular: Normal rate and regular rhythm.  Pulmonary/Chest: Effort normal and breath sounds normal. She has no wheezes. She has no rales.  Abdominal: Soft. There is no rebound and no guarding.  Patient with tenderness to palpation in the left lower quadrant and right lower quadrants.  Genitourinary: Cervix exhibits discharge. Vaginal discharge found.  Neurological: She is alert and oriented to person, place, and time.  Psychiatric: She has a normal mood and affect. Her behavior is normal.  Nursing note and vitals reviewed.  UC Treatments / Results  Labs (all labs ordered are listed, but only abnormal results are displayed) Labs Reviewed  WET PREP, GENITAL - Abnormal; Notable for the following  components:      Result Value   Trich, Wet Prep PRESENT (*)    WBC, Wet Prep HPF POC FEW (*)    All other components within normal limits  URINALYSIS, COMPLETE (UACMP) WITH MICROSCOPIC - Abnormal; Notable for the following components:   Specific Gravity, Urine >1.030 (*)    Bilirubin Urine SMALL (*)    Protein, ur TRACE (*)    Squamous Epithelial / LPF 6-30 (*)    All other components within normal limits  CHLAMYDIA/NGC RT PCR ([facility] ONLY)  PREGNANCY, URINE   EKG None Radiology No results found.  Procedures Procedures (including critical care time)  Medications Ordered in UC Medications - No data to display   Initial Impression / Assessment and Plan / UC Course  I have reviewed the triage vital signs and the nursing notes.  Pertinent labs & imaging results that were available during my care of the patient were reviewed by me and considered in my medical decision making (see chart for details).    27 year old female presents with concern for STD.  Given her symptomatology as well as prior history of PID in addition to her physical exam findings, I am treating her for PID.  She has a documented severe allergy to penicillin which precludes the use of cephalosporin.  I discussed this with infectious disease, Dr. Ninetta LightsHatcher.  He recommended Levaquin and azithromycin (2 g x 1).  Patient also found to have Trichomonas.  Treated with Flagyl.  Advise close follow-up with primary care.  Final Clinical Impressions(s) / UC Diagnoses   Final diagnoses:  PID (acute pelvic inflammatory disease)  Trichomonas infection    ED Discharge Orders        Ordered    metroNIDAZOLE (FLAGYL) 500 MG tablet   Once     04/02/18 0933    levofloxacin (LEVAQUIN) 500 MG tablet  Daily     04/02/18 0933    azithromycin (ZITHROMAX) 250 MG tablet   Once     04/02/18 0933     Controlled Substance Prescriptions [place] Controlled Substance Registry consulted? Not Applicable   Tommie SamsCook, Venora Kautzman G,  [place] 04/02/18 [id number]

## 2018-04-21 ENCOUNTER — Telehealth: Payer: Self-pay | Admitting: Emergency Medicine

## 2018-04-21 NOTE — Telephone Encounter (Signed)
Patient called stating that she had vomited some of the Levaquin antibiotic and wants to know if she needs more. I advised no because GC/Chlam were negative and no treatment needed per Dr. Everlene OtherJayce Cook. Advised patient to finish all the Flagyl to treat Trich. Patient agreed and voiced understanding.

## 2018-11-19 ENCOUNTER — Encounter: Payer: Self-pay | Admitting: Emergency Medicine

## 2018-11-19 ENCOUNTER — Other Ambulatory Visit: Payer: Self-pay

## 2018-11-19 ENCOUNTER — Emergency Department
Admission: EM | Admit: 2018-11-19 | Discharge: 2018-11-19 | Disposition: A | Discharge status: Home / Self Care | Payer: Medicaid Other | Attending: Emergency Medicine | Admitting: Emergency Medicine

## 2018-11-19 DIAGNOSIS — N760 Acute vaginitis: Secondary | ICD-10-CM | POA: Insufficient documentation

## 2018-11-19 DIAGNOSIS — B9689 Other specified bacterial agents as the cause of diseases classified elsewhere: Secondary | ICD-10-CM | POA: Insufficient documentation

## 2018-11-19 DIAGNOSIS — N390 Urinary tract infection, site not specified: Secondary | ICD-10-CM | POA: Insufficient documentation

## 2018-11-19 DIAGNOSIS — R102 Pelvic and perineal pain: Secondary | ICD-10-CM

## 2018-11-19 DIAGNOSIS — Z87891 Personal history of nicotine dependence: Secondary | ICD-10-CM | POA: Insufficient documentation

## 2018-11-19 LAB — URINALYSIS, ROUTINE W REFLEX MICROSCOPIC
BILIRUBIN URINE: NEGATIVE
Glucose, UA: NEGATIVE mg/dL
Hgb urine dipstick: NEGATIVE
KETONES UR: NEGATIVE mg/dL
Nitrite: NEGATIVE
PH: 6 (ref 5.0–8.0)
Protein, ur: NEGATIVE mg/dL
Specific Gravity, Urine: 1.019 (ref 1.005–1.030)

## 2018-11-19 LAB — BASIC METABOLIC PANEL
ANION GAP: 7 (ref 5–15)
BUN: 14 mg/dL (ref 6–20)
CO2: 28 mmol/L (ref 22–32)
Calcium: 9.5 mg/dL (ref 8.9–10.3)
Chloride: 104 mmol/L (ref 98–111)
Creatinine, Ser: 1.01 mg/dL — ABNORMAL HIGH (ref 0.44–1.00)
GFR calc Af Amer: >60 mL/min (ref >60)
GLUCOSE: 97 mg/dL (ref 70–99)
POTASSIUM: 4.2 mmol/L (ref 3.5–5.1)
Sodium: 139 mmol/L (ref 135–145)

## 2018-11-19 LAB — WET PREP, GENITAL
Sperm: NONE SEEN
Trich, Wet Prep: NONE SEEN
YEAST WET PREP: NONE SEEN

## 2018-11-19 LAB — CBC WITH DIFFERENTIAL/PLATELET
ABS IMMATURE GRANULOCYTES: 0.03 10*3/uL (ref 0.00–0.07)
Basophils Absolute: 0 10*3/uL (ref 0.0–0.1)
Basophils Relative: 0 %
Eosinophils Absolute: 0.2 10*3/uL (ref 0.0–0.5)
Eosinophils Relative: 2 %
HEMATOCRIT: 43.5 % (ref 36.0–46.0)
Hemoglobin: 14.1 g/dL (ref 12.0–15.0)
IMMATURE GRANULOCYTES: 0 %
LYMPHS ABS: 4.6 10*3/uL — AB (ref 0.7–4.0)
Lymphocytes Relative: 42 %
MCH: 30.5 pg (ref 26.0–34.0)
MCHC: 32.4 g/dL (ref 30.0–36.0)
MCV: 94.2 fL (ref 80.0–100.0)
MONOS PCT: 4 %
Monocytes Absolute: 0.5 10*3/uL (ref 0.1–1.0)
NEUTROS ABS: 5.5 10*3/uL (ref 1.7–7.7)
NEUTROS PCT: 52 %
Platelets: 194 10*3/uL (ref 150–400)
RBC: 4.62 MIL/uL (ref 3.87–5.11)
RDW: 14.2 % (ref 11.5–15.5)
WBC: 10.9 10*3/uL — ABNORMAL HIGH (ref 4.0–10.5)
nRBC: 0 % (ref 0.0–0.2)

## 2018-11-19 LAB — CHLAMYDIA/NGC RT PCR (ARMC ONLY)
Chlamydia Tr: DETECTED — AB
N gonorrhoeae: NOT DETECTED

## 2018-11-19 LAB — HCG, QUANTITATIVE, PREGNANCY: hCG, Beta Chain, Quant, S: <1 m[IU]/mL (ref <5)

## 2018-11-19 MED ORDER — NITROFURANTOIN MONOHYD MACRO 100 MG PO CAPS: 100.0000 mg | ORAL_CAPSULE | Freq: Two times a day (BID) | ORAL | 0 refills | Status: AC

## 2018-11-19 MED ORDER — METRONIDAZOLE 500 MG PO TABS: 500.0000 mg | ORAL_TABLET | Freq: Two times a day (BID) | ORAL | 0 refills | Status: AC

## 2018-11-19 NOTE — ED Notes (Signed)
Verbalizes understanding of discharge instructions.

## 2018-11-19 NOTE — ED Notes (Signed)
Pt states she just used the restroom prior to triage, is unable to provide urine specimen at this time.

## 2018-11-19 NOTE — ED Triage Notes (Signed)
Pt in via POV with complaints of pelvic cramping x 2 days, denies any vaginal bleeding or discharge.  Pt denies any urinary symptoms.  Vitals WDL, NAD noted at this time.

## 2018-11-19 NOTE — ED Notes (Signed)
Pt given gown to undress for pelvic exam, pt reports 2 days of pelvic cramping, denies any vaginal bleeding or discharge.

## 2018-11-19 NOTE — ED Provider Notes (Signed)
[facility] Emergency Department Provider Note   ____________________________________________   I have reviewed the triage vital signs and the nursing notes.   HISTORY  Chief Complaint Pelvic Pain   History limited by: Not Limited   HPI Melanie Duran is a 27 y.o. female who presents to the emergency department today because of concern for pelvic pain. The patient states it has been present for the past 2 days. Located in the right pelvic area. The patient states that the pain has been fairly constant. Does have some radiation to the right flank. The patient denies any change in urination. Denies any abnormal vaginal discharge. Does have a history of PID but states that this feels different. No fevers.  Per medical record review patient has a history of PID  History reviewed. No pertinent past medical history.  There are no active problems to display for this patient.   Past Surgical History:  Procedure Laterality Date  . TONSILLECTOMY      Prior to Admission medications   Medication Sig Start Date End Date Taking? Authorizing Provider  levofloxacin (LEVAQUIN) 500 MG tablet Take 1 tablet (500 mg total) by mouth daily. 04/02/18   Tommie Sams, DO    Allergies Amoxicillin  No family history on file.  Social History Social History   Tobacco Use  . Smoking status: Former Smoker    Packs/day: 0.50    Types: Cigarettes  . Smokeless tobacco: Never Used  Substance Use Topics  . Alcohol use: Not Currently  . Drug use: No    Review of Systems Constitutional: No fever/chills Eyes: No visual changes. ENT: No sore throat. Cardiovascular: Denies chest pain. Respiratory: Denies shortness of breath. Gastrointestinal: Positive for right pelvic pain. Genitourinary: Negative for dysuria. Musculoskeletal: Negative for back pain. Skin: Negative for rash. Neurological: Negative for headaches, focal weakness or  numbness.  ____________________________________________   PHYSICAL EXAM:  VITAL SIGNS: ED Triage Vitals  Enc Vitals Group     BP 11/19/18 1714 123/76     Pulse Rate 11/19/18 1714 66     Resp 11/19/18 1714 16     Temp 11/19/18 1714 97.9 F (36.6 C)     Temp Source 11/19/18 1714 Oral     SpO2 11/19/18 1714 100 %     Weight 11/19/18 1718 182 lb (82.6 kg)     Height 11/19/18 1718 5\' 3"  (1.6 m)     Head Circumference --      Peak Flow --      Pain Score 11/19/18 1718 8   Constitutional: Alert and oriented.  Eyes: Conjunctivae are normal.  ENT      Head: Normocephalic and atraumatic.      Nose: No congestion/rhinnorhea.      Mouth/Throat: Mucous membranes are moist.      Neck: No stridor. Hematological/Lymphatic/Immunilogical: No cervical lymphadenopathy. Cardiovascular: Normal rate, regular rhythm.  No murmurs, rubs, or gallops.  Respiratory: Normal respiratory effort without tachypnea nor retractions. Breath sounds are clear and equal bilaterally. No wheezes/rales/rhonchi. Gastrointestinal: Soft and tender to palpation in the right lower quadrant. Genitourinary: No external lesions. Small amount of fluid in vaginal vault. No CMT. No adnexal fullness or tenderness. Exam performed with Chelsea Primus RN  Musculoskeletal: Normal range of motion in all extremities. No lower extremity edema. Neurologic:  Normal speech and language. No gross focal neurologic deficits are appreciated.  Skin:  Skin is warm, dry and intact. No rash noted. Psychiatric: Mood and affect are normal. Speech and behavior  are normal. Patient exhibits appropriate insight and judgment.  ____________________________________________    LABS (pertinent positives/negatives)  Wet prep clue cells present CBC wbc 10.9, hgb 14.1, plt 194 BMP wnl except cr 1.01 UA mod leukocytes, 6-10 wbc, rare bacteria, 6-10 squamous  cell  ____________________________________________   EKG  None  ____________________________________________    RADIOLOGY  None  ____________________________________________   PROCEDURES  Procedures  ____________________________________________   INITIAL IMPRESSION / ASSESSMENT AND PLAN / ED COURSE  Pertinent labs & imaging results that were available during my care of the patient were reviewed by me and considered in my medical decision making (see chart for details).   Patient presented to the emergency department today because of concerns for right pelvic pain.  Patient does have a history of PID.  Pelvic exam however did not show any CMT.  Small amount of normal vaginal discharge.  Wet prep was positive for clue cells.  Urine also had some white blood cells concern for possible urinary tract infection.  Given location of pain I did suggest and initially order an ultrasound.  However patient stated she needed to leave prior to the ultrasound be performed because she had a sick child at home.  I did discuss with patient return precautions.   ____________________________________________   FINAL CLINICAL IMPRESSION(S) / ED DIAGNOSES  Final diagnoses:  Pelvic pain  BV (bacterial vaginosis)  Lower urinary tract infectious disease     Note: This dictation was prepared with Dragon dictation. Any transcriptional errors that result from this process are unintentional     Phineas SemenGoodman, Emberlyn Burlison, MD 11/19/18 1947

## 2018-11-19 NOTE — ED Notes (Signed)
Pt reports she needs to leave due to ill child at home. Dr. Derrill KayGoodman at bedside

## 2018-11-19 NOTE — Discharge Instructions (Addendum)
Please seek medical attention for any high fevers, chest pain, shortness of breath, change in behavior, persistent vomiting, bloody stool or any other new or concerning symptoms.  

## 2018-11-21 LAB — RPR: RPR Ser Ql: NONREACTIVE

## 2018-11-22 ENCOUNTER — Telehealth: Payer: Self-pay | Admitting: Emergency Medicine

## 2018-11-22 NOTE — Telephone Encounter (Addendum)
Called patient due to positive chlamydia test and need for treatment and partner treatment.  Per dr Derrill Kaygoodman call in azithromycin 1 gram to pateint preffered pharmacy.  Called the mobile number and her mother answered and says that she will have patient call me back.   11/26/2018-- called mobile number again and [facility], but she never came to phone.  I called again and got answering machine--I left a message asking her to call me back.

## 2018-11-24 LAB — HIV ANTIBODY (ROUTINE TESTING W REFLEX): HIV Screen 4th Generation wRfx: NONREACTIVE

## 2018-12-01 NOTE — Telephone Encounter (Addendum)
Called patient again and got mom.  She will have patient call me today.   Patient called me back.  I have called the med to [facility].  She understands her partner can call [facility] for treatment and no sex for 2 weeks after both are treated.

## 2019-02-13 ENCOUNTER — Encounter: Payer: Self-pay | Admitting: Emergency Medicine

## 2019-02-13 ENCOUNTER — Other Ambulatory Visit: Payer: Self-pay

## 2019-02-13 ENCOUNTER — Emergency Department
Admission: EM | Admit: 2019-02-13 | Discharge: 2019-02-13 | Disposition: A | Discharge status: Home / Self Care | Payer: Self-pay | Attending: Emergency Medicine | Admitting: Emergency Medicine

## 2019-02-13 DIAGNOSIS — R102 Pelvic and perineal pain: Secondary | ICD-10-CM | POA: Insufficient documentation

## 2019-02-13 DIAGNOSIS — Z3202 Encounter for pregnancy test, result negative: Secondary | ICD-10-CM | POA: Insufficient documentation

## 2019-02-13 LAB — CHLAMYDIA/NGC RT PCR (ARMC ONLY)
CHLAMYDIA TR: DETECTED — AB
N gonorrhoeae: NOT DETECTED

## 2019-02-13 LAB — URINALYSIS, COMPLETE (UACMP) WITH MICROSCOPIC
BACTERIA UA: NONE SEEN
Bilirubin Urine: NEGATIVE
Glucose, UA: NEGATIVE mg/dL
Hgb urine dipstick: NEGATIVE
Ketones, ur: 80 mg/dL — AB
Leukocytes,Ua: NEGATIVE
Nitrite: NEGATIVE
Protein, ur: NEGATIVE mg/dL
SPECIFIC GRAVITY, URINE: 1.026 (ref 1.005–1.030)
pH: 5 (ref 5.0–8.0)

## 2019-02-13 LAB — BASIC METABOLIC PANEL
ANION GAP: 8 (ref 5–15)
BUN: 11 mg/dL (ref 6–20)
CHLORIDE: 104 mmol/L (ref 98–111)
CO2: 25 mmol/L (ref 22–32)
CREATININE: 1.06 mg/dL — AB (ref 0.44–1.00)
Calcium: 8.8 mg/dL — ABNORMAL LOW (ref 8.9–10.3)
GFR calc non Af Amer: >60 mL/min (ref >60)
Glucose, Bld: 87 mg/dL (ref 70–99)
POTASSIUM: 4.1 mmol/L (ref 3.5–5.1)
Sodium: 137 mmol/L (ref 135–145)

## 2019-02-13 LAB — WET PREP, GENITAL
Clue Cells Wet Prep HPF POC: NONE SEEN
Sperm: NONE SEEN
TRICH WET PREP: NONE SEEN
Yeast Wet Prep HPF POC: NONE SEEN

## 2019-02-13 LAB — CBC
HEMATOCRIT: 47.5 % — AB (ref 36.0–46.0)
HEMOGLOBIN: 15.4 g/dL — AB (ref 12.0–15.0)
MCH: 30.1 pg (ref 26.0–34.0)
MCHC: 32.4 g/dL (ref 30.0–36.0)
MCV: 93 fL (ref 80.0–100.0)
NRBC: 0 % (ref 0.0–0.2)
Platelets: 168 10*3/uL (ref 150–400)
RBC: 5.11 MIL/uL (ref 3.87–5.11)
RDW: 13.3 % (ref 11.5–15.5)
WBC: 5 10*3/uL (ref 4.0–10.5)

## 2019-02-13 LAB — PREGNANCY, URINE: Preg Test, Ur: NEGATIVE

## 2019-02-13 MED ORDER — DOXYCYCLINE HYCLATE 100 MG PO CAPS: 100.0000 mg | ORAL_CAPSULE | Freq: Two times a day (BID) | ORAL | 0 refills | Status: DC

## 2019-02-13 MED ORDER — AZITHROMYCIN 1 G PO PACK
1.0000 g | PACK | Freq: Once | ORAL | Status: AC
  Administered 2019-02-13: 1 g via ORAL
  Filled 2019-02-13 (×2): qty 1

## 2019-02-13 MED ORDER — CEFTRIAXONE SODIUM 250 MG IJ SOLR
250.0000 mg | Freq: Once | INTRAMUSCULAR | Status: AC
  Administered 2019-02-13: 250 mg via INTRAMUSCULAR
  Filled 2019-02-13: qty 250

## 2019-02-13 MED ORDER — ONDANSETRON HCL 4 MG/2ML IJ SOLN: 4.0000 mg | Freq: Once | INTRAMUSCULAR | Status: DC

## 2019-02-13 NOTE — Discharge Instructions (Signed)
While we cannot know if you have an STD or not at this time, we do advised that you not have sex until the results are known to you.  The meantime we are giving you antibiotics.  If you do have positive cultures you should have everyone that you are sexually active with tested prior to resuming sexual activity with them.  Return to the emergency room for any new or worrisome symptoms including increased pain fever vomiting or if you feel worse in any way.

## 2019-02-13 NOTE — ED Triage Notes (Signed)
Pt presents to ED via POV c/o bil flank pain since yesterday. Denies dysuria but states last time she had this problem it was caused by UTI.

## 2019-02-13 NOTE — ED Provider Notes (Signed)
[facility] Emergency Department Provider Note  ____________________________________________   I have reviewed the triage vital signs and the nursing notes. Where available I have reviewed prior notes and, if possible and indicated, outside hospital notes.    HISTORY  Chief Complaint Flank Pain    HPI Melanie Duran is a 28 y.o. female presents today complaining prepubic discomfort.  Patient states she has had a since yesterday.  She actually denies flank pain to me, she to me states that she has a discomfort cramping-like in her suprapubic region.  Similar to prior visits here.  Last time she was here she has had gonorrhea and chlamydia.  She states he is no longer with that partner.  She does have a vaginal discharge this time as well again.  She does not have any fever or chills.  Pain began gradually yesterday.  No other associated symptoms.  No dysuria no urinary frequency no constipation no diarrhea no vomiting, it is a mild discomfort.   History reviewed. No pertinent past medical history.  There are no active problems to display for this patient.   Past Surgical History:  Procedure Laterality Date  . TONSILLECTOMY      Prior to Admission medications   Medication Sig Start Date End Date Taking? Authorizing Provider  levofloxacin (LEVAQUIN) 500 MG tablet Take 1 tablet (500 mg total) by mouth daily. 04/02/18   Tommie Sams, DO    Allergies Amoxicillin  History reviewed. No pertinent family history.  Social History Social History   Tobacco Use  . Smoking status: Former Smoker    Packs/day: 0.50    Types: Cigarettes  . Smokeless tobacco: Never Used  Substance Use Topics  . Alcohol use: Not Currently  . Drug use: No    Review of Systems Constitutional: No fever/chills Eyes: No visual changes. ENT: No sore throat. No stiff neck no neck pain Cardiovascular: Denies chest pain. Respiratory: Denies shortness of breath. Gastrointestinal:    no vomiting.  No diarrhea.  No constipation. Genitourinary: Negative for dysuria. Musculoskeletal: Negative lower extremity swelling Skin: Negative for rash. Neurological: Negative for severe headaches, focal weakness or numbness.   ____________________________________________   PHYSICAL EXAM:  VITAL SIGNS: ED Triage Vitals  Enc Vitals Group     BP 02/13/19 0922 116/74     Pulse Rate 02/13/19 0922 86     Resp 02/13/19 0922 16     Temp 02/13/19 0922 98.5 F (36.9 C)     Temp Source 02/13/19 0922 Oral     SpO2 02/13/19 0922 100 %     Weight 02/13/19 0928 190 lb (86.2 kg)     Height 02/13/19 0928 5\' 3"  (1.6 m)     Head Circumference --      Peak Flow --      Pain Score 02/13/19 0928 10     Pain Loc --      Pain Edu? --      Excl. in GC? --     Constitutional: Alert and oriented. Well appearing and in no acute distress. Eyes: Conjunctivae are normal Head: Atraumatic HEENT: No congestion/rhinnorhea. Mucous membranes are moist.  Oropharynx non-erythematous Neck:   Nontender with no meningismus, no masses, no stridor Cardiovascular: Normal rate, regular rhythm. Grossly normal heart sounds.  Good peripheral circulation. Respiratory: Normal respiratory effort.  No retractions. Lungs CTAB. Abdominal: Soft and nontender. No distention. No guarding no rebound Back:  There is no focal tenderness or step off.  there is no midline  tenderness there are no lesions noted. there is no CVA tenderness Pelvic exam: Female nurse chaperone present, no external lesions noted, positive for significant whitish vaginal discharge noted with no purulent discharge, no cervical motion tenderness, no adnexal tenderness or mass, there is no significant uterine tenderness or mass. No vaginal bleeding Musculoskeletal: No lower extremity tenderness, no upper extremity tenderness. No joint effusions, no DVT signs strong distal pulses no edema Neurologic:  Normal speech and language. No gross focal neurologic  deficits are appreciated.  Skin:  Skin is warm, dry and intact. No rash noted. Psychiatric: Mood and affect are normal. Speech and behavior are normal.  ____________________________________________   LABS (all labs ordered are listed, but only abnormal results are displayed)  Labs Reviewed  URINALYSIS, COMPLETE (UACMP) WITH MICROSCOPIC - Abnormal; Notable for the following components:      Result Value   Color, Urine YELLOW (*)    APPearance CLEAR (*)    Ketones, ur 80 (*)    All other components within normal limits  BASIC METABOLIC PANEL - Abnormal; Notable for the following components:   Creatinine, Ser 1.06 (*)    Calcium 8.8 (*)    All other components within normal limits  CBC - Abnormal; Notable for the following components:   Hemoglobin 15.4 (*)    HCT 47.5 (*)    All other components within normal limits  CHLAMYDIA/NGC RT PCR ([facility] ONLY)  WET PREP, GENITAL  PREGNANCY, URINE  POC URINE PREG, ED    Pertinent labs  results that were available during my care of the patient were reviewed by me and considered in my medical decision making (see chart for details). ____________________________________________  EKG  I personally interpreted any EKGs ordered by me or triage  ____________________________________________  RADIOLOGY  Pertinent labs & imaging results that were available during my care of the patient were reviewed by me and considered in my medical decision making (see chart for details). If possible, patient and/or family made aware of any abnormal findings.  No results found. ____________________________________________    PROCEDURES  Procedure(s) performed: None  Procedures  Critical Care performed: None  ____________________________________________   INITIAL IMPRESSION / ASSESSMENT AND PLAN / ED COURSE  Pertinent labs & imaging results that were available during my care of the patient were reviewed by me and considered in my medical  decision making (see chart for details).  With a history of recurrent STIs presents today complaining of suprapubic discomfort.  She has had penicillin in the past with no problem.  We will give her Rocephin and Zithromax.  Nothing to suggest significant PID no cervical motion tenderness adnexal tenderness to suggest PID nothing to suggest appendicitis, gallbladder disease, BV is possible she has had that before, however there are no clue cells.  No evidence of yeast infection.  No evidence of trichomonas.  Will treat her for possible gonorrhea and chlamydia, and have her follow closely with her primary care doctor.  No evidence of UTI.  Return precautions and follow-up given understood and serial abdominal exams are benign.    ____________________________________________   FINAL CLINICAL IMPRESSION(S) / ED DIAGNOSES  Final diagnoses:  None      This chart was dictated using voice recognition software.  Despite best efforts to proofread,  errors can occur which can change meaning.      Jeanmarie Plant, MD 02/13/19 1306

## 2019-02-15 ENCOUNTER — Telehealth: Payer: Self-pay | Admitting: Emergency Medicine

## 2019-02-15 NOTE — Telephone Encounter (Signed)
Called patient and informed her of test results positive for chlamydia.  Her partner has not been treated.  I asked her to have him call [facility] to get treated.

## 2019-05-25 IMAGING — CT CT ABD-PELV W/ CM
2 of 4 series · 16 of 46 positions shown, 18 images · IV contrast (APPLIED)
Comparison: None.

CLINICAL DATA: Right-sided mid and lower abdominal pain, nausea,
and fever beginning yesterday. Clinical suspicion for appendicitis.

EXAM:
CT ABDOMEN AND PELVIS WITH CONTRAST
TECHNIQUE: Multidetector CT imaging of the abdomen and pelvis was performed
using the standard protocol following bolus administration of
intravenous contrast.
CONTRAST:  100mL 97RGTX-ISS IOPAMIDOL (97RGTX-ISS) INJECTION 61%

[Series 2: routine abd/pel with · axial · 0.67mm/px · z∈[-958,-528]mm · 13 of 94 slices shown, 15 images]
[im 4/94  soft-tissue]
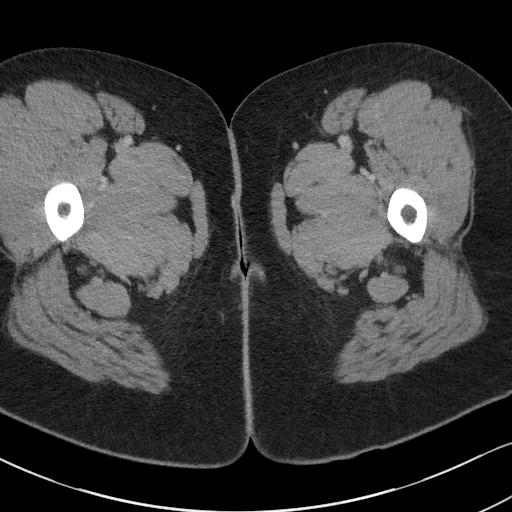
[im 4/94  bone]
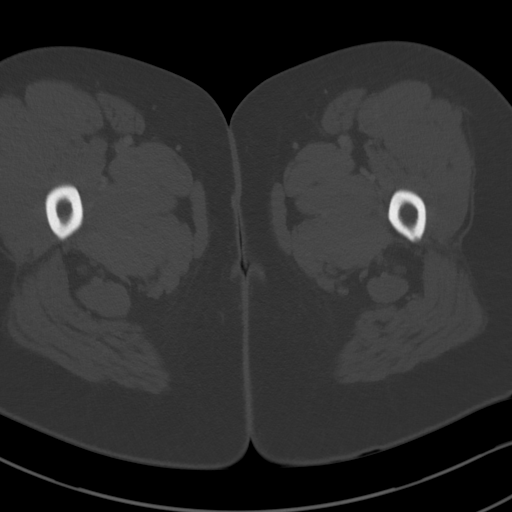
[im 12/94  soft-tissue]
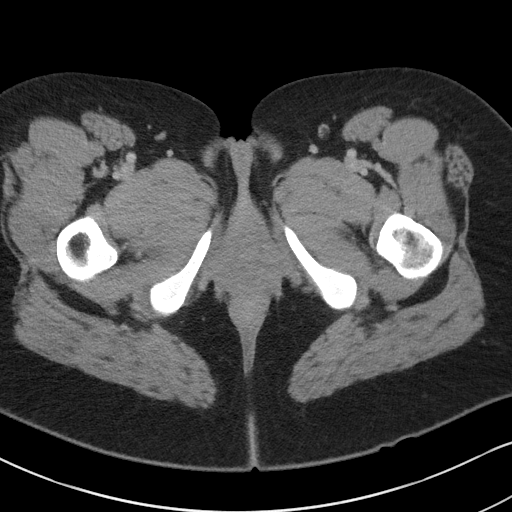
[im 19/94  soft-tissue]
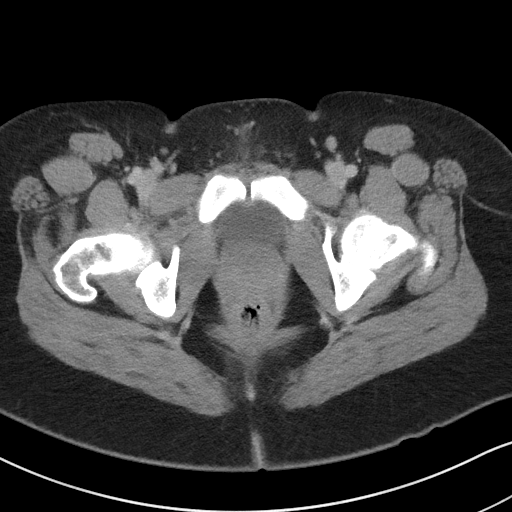
[im 27/94  soft-tissue]
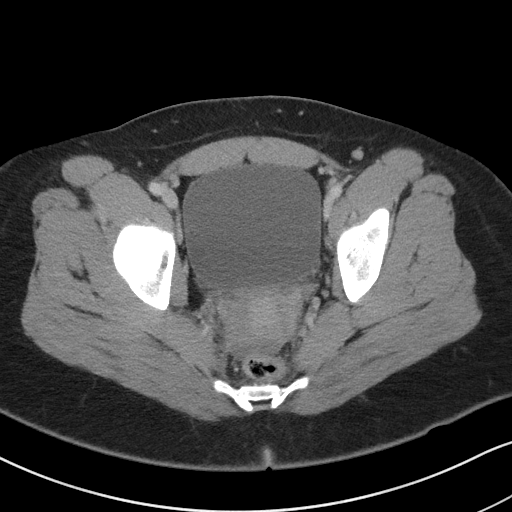
[im 34/94  soft-tissue]
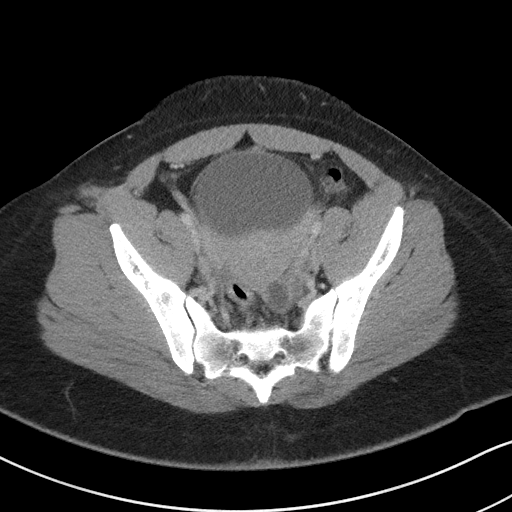
[im 41/94  soft-tissue]
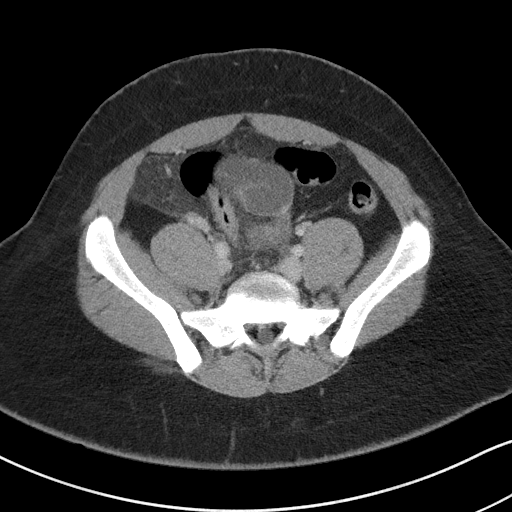
[im 49/94  soft-tissue]
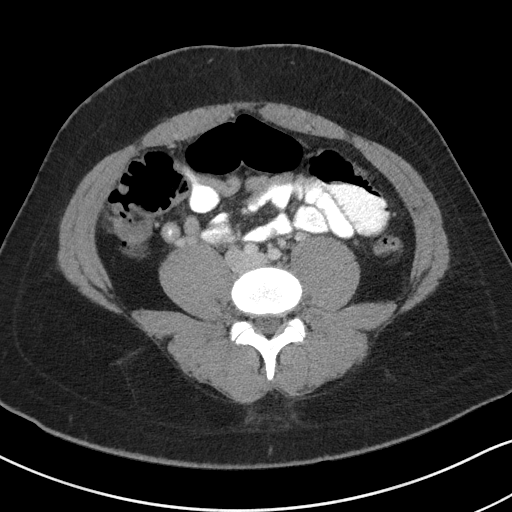
[im 53/94  soft-tissue]
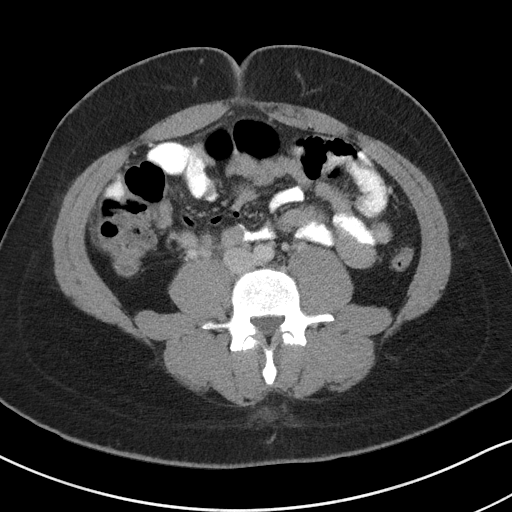
[im 60/94  soft-tissue]
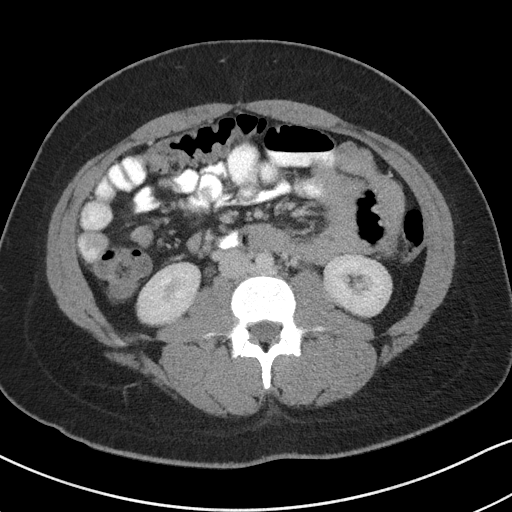
[im 60/94  bone]
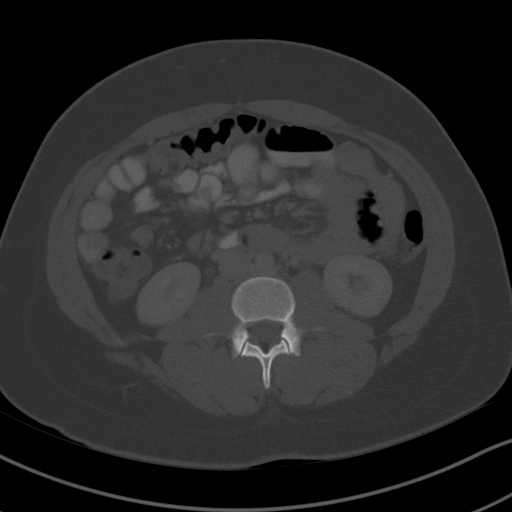
[im 67/94  soft-tissue]
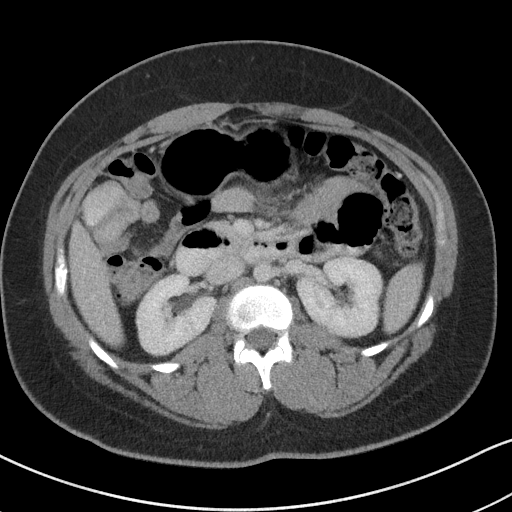
[im 75/94  soft-tissue]
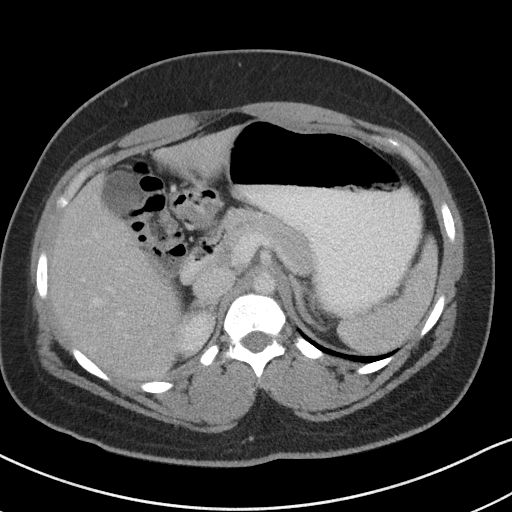
[im 82/94  soft-tissue]
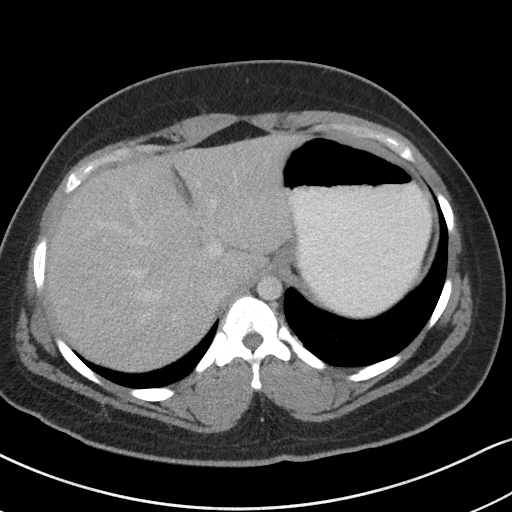
[im 90/94  soft-tissue]
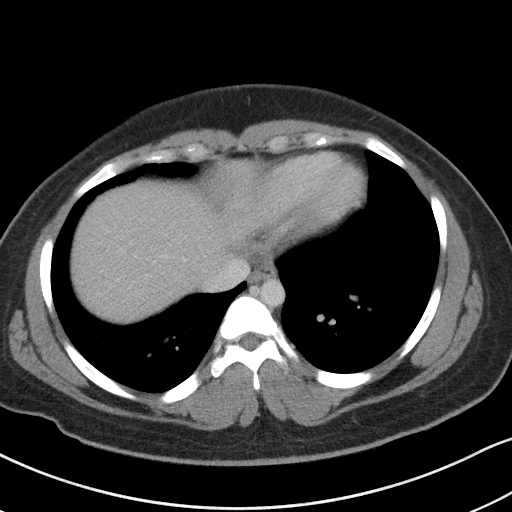

[Series 5: coronal st · coronal · 0.70mm/px · 3 of 92 slices shown]
[im 31/92  soft-tissue]
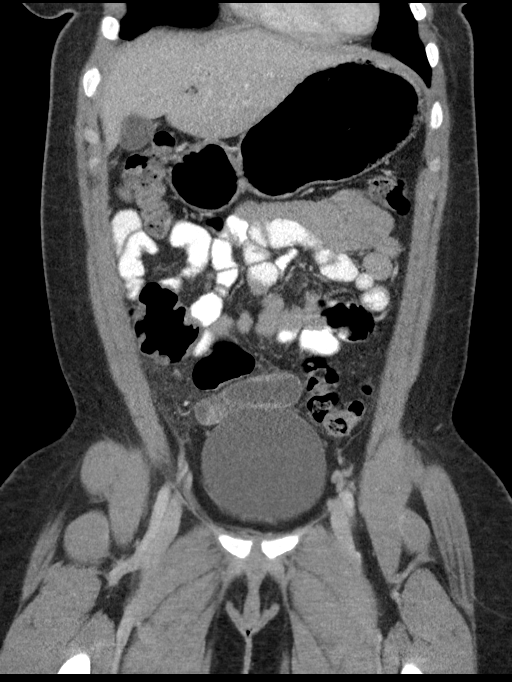
[im 41/92  soft-tissue]
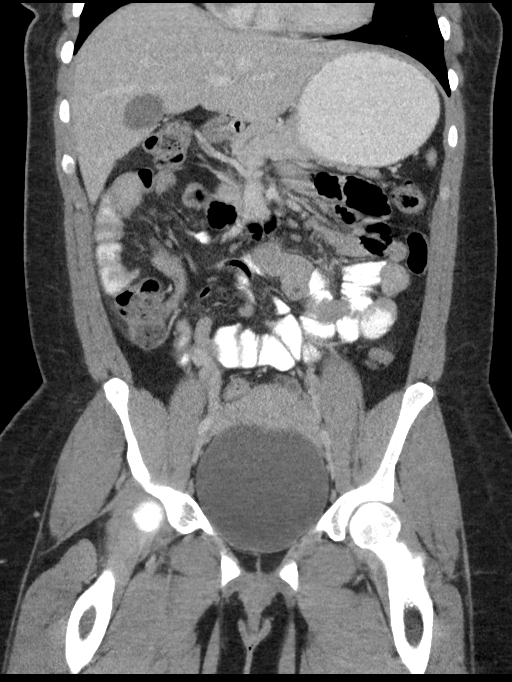
[im 51/92  soft-tissue]
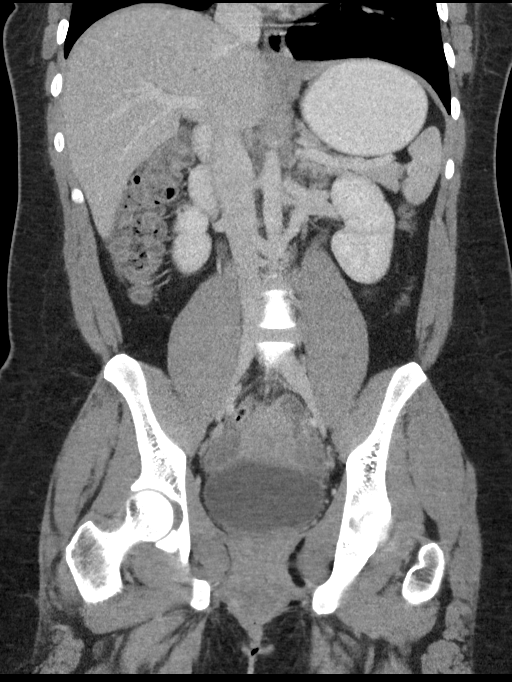

[16 of 46 positions shown; findings below may reference images not displayed]

FINDINGS: Lower Chest: No acute findings.

Hepatobiliary: No hepatic masses identified. Gallbladder is
unremarkable.

Pancreas:  No mass or inflammatory changes.

Spleen: Within normal limits in size and appearance.

Adrenals/Urinary Tract: No masses identified. No evidence of
hydronephrosis.

Stomach/Bowel: No evidence of obstruction, inflammatory process or
abnormal fluid collections. Normal appendix visualized.

Vascular/Lymphatic: No pathologically enlarged lymph nodes. No
abdominal aortic aneurysm.

Reproductive: Normal appearance of uterus. Large right and small
left hydrosalpinges are seen bilaterally. Inflammatory changes are
seen surrounding the right hydrosalpinx. This is consistent with
hydro-pyosalpinx due to pelvic inflammatory disease. Tiny amount of
free fluid seen in pelvic cul-de-sac.

Other:  None.

Musculoskeletal:  No suspicious bone lesions identified.
IMPRESSION: Large right hydro-pyosalpinx due to pelvic inflammatory disease.
Small left hydrosalpinx also noted.

No evidence of appendicitis.

## 2019-09-22 ENCOUNTER — Ambulatory Visit: Payer: Self-pay

## 2019-09-26 ENCOUNTER — Ambulatory Visit: Payer: Self-pay | Admitting: Advanced Practice Midwife

## 2019-09-26 ENCOUNTER — Other Ambulatory Visit: Payer: Self-pay

## 2019-09-26 DIAGNOSIS — Z62810 Personal history of physical and sexual abuse in childhood: Secondary | ICD-10-CM

## 2019-09-26 DIAGNOSIS — Z91410 Personal history of adult physical and sexual abuse: Secondary | ICD-10-CM

## 2019-09-26 DIAGNOSIS — Z113 Encounter for screening for infections with a predominantly sexual mode of transmission: Secondary | ICD-10-CM

## 2019-09-26 DIAGNOSIS — F17200 Nicotine dependence, unspecified, uncomplicated: Secondary | ICD-10-CM | POA: Insufficient documentation

## 2019-09-26 LAB — WET PREP FOR TRICH, YEAST, CLUE
Trichomonas Exam: NEGATIVE
Yeast Exam: NEGATIVE

## 2019-09-26 LAB — PREGNANCY, URINE: Preg Test, Ur: NEGATIVE

## 2019-09-26 NOTE — Addendum Note (Signed)
Addended by: Donnal Moat on: 09/26/2019 03:37 PM   Modules accepted: Orders

## 2019-09-26 NOTE — Progress Notes (Addendum)
Here today for STD screening. Accepts bloodwork. Josedaniel Haye, RN  Wet Prep results reviewed. Per standing orders no treatment indicated. Velmer Broadfoot, RN   
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
## 2019-10-01 LAB — GONOCOCCUS CULTURE

## 2019-10-03 ENCOUNTER — Telehealth: Payer: Self-pay | Admitting: Family Medicine

## 2019-10-03 NOTE — Telephone Encounter (Signed)
Phone call to pt. Pt knew password from 09/26/2019 visit. Pt informed of the only TR we have at this time and that others still pending. Pt states she is signed up for MyChart. Pt counseled that if anything is positive or a problem we will try to get in touch with her. Pt declined TR appt.

## 2019-10-03 NOTE — Telephone Encounter (Signed)
WANTS TR °

## 2019-10-05 NOTE — Addendum Note (Signed)
Addended by: Cletis Media on: 10/05/2019 08:51 AM   Modules accepted: Orders

## 2019-10-12 ENCOUNTER — Telehealth: Payer: Self-pay | Admitting: Family Medicine

## 2019-10-12 NOTE — Telephone Encounter (Signed)
Wants test results

## 2019-10-12 NOTE — Telephone Encounter (Signed)
TC to patient. Verified ID via password. Discussed all results from 09/26/19 state lab negative. No other concerns or questions at this time. Aileen Fass, RN

## 2020-08-20 ENCOUNTER — Encounter: Payer: Self-pay | Admitting: Advanced Practice Midwife

## 2020-08-20 ENCOUNTER — Other Ambulatory Visit: Payer: Self-pay

## 2020-08-20 ENCOUNTER — Ambulatory Visit (LOCAL_COMMUNITY_HEALTH_CENTER): Payer: Self-pay | Admitting: Advanced Practice Midwife

## 2020-08-20 VITALS — BP 107/71 | Ht 63.0 in | Wt 198.0 lb

## 2020-08-20 DIAGNOSIS — E669 Obesity, unspecified: Secondary | ICD-10-CM | POA: Insufficient documentation

## 2020-08-20 DIAGNOSIS — Z91410 Personal history of adult physical and sexual abuse: Secondary | ICD-10-CM

## 2020-08-20 DIAGNOSIS — Z30013 Encounter for initial prescription of injectable contraceptive: Secondary | ICD-10-CM

## 2020-08-20 DIAGNOSIS — N760 Acute vaginitis: Secondary | ICD-10-CM

## 2020-08-20 DIAGNOSIS — F319 Bipolar disorder, unspecified: Secondary | ICD-10-CM

## 2020-08-20 DIAGNOSIS — Z3009 Encounter for other general counseling and advice on contraception: Secondary | ICD-10-CM

## 2020-08-20 DIAGNOSIS — Z62810 Personal history of physical and sexual abuse in childhood: Secondary | ICD-10-CM

## 2020-08-20 DIAGNOSIS — F17200 Nicotine dependence, unspecified, uncomplicated: Secondary | ICD-10-CM

## 2020-08-20 DIAGNOSIS — B9689 Other specified bacterial agents as the cause of diseases classified elsewhere: Secondary | ICD-10-CM

## 2020-08-20 LAB — WET PREP FOR TRICH, YEAST, CLUE
Trichomonas Exam: NEGATIVE
Yeast Exam: NEGATIVE

## 2020-08-20 MED ORDER — METRONIDAZOLE 500 MG PO TABS: 500.0000 mg | ORAL_TABLET | Freq: Two times a day (BID) | ORAL | 0 refills | Status: AC

## 2020-08-20 NOTE — Progress Notes (Signed)
Patient here for PE and STD check. Thinks last Pap may have been 2014. Marland KitchenBurt Knack, RN
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
## 2020-08-20 NOTE — Progress Notes (Signed)
Wet mount reviewed, patient treated for BV per SO..Carvel Huskins Brewer-Jensen, RN  

## 2020-08-22 LAB — IGP, RFX APTIMA HPV ASCU: PAP Smear Comment: 0

## 2021-04-15 ENCOUNTER — Other Ambulatory Visit: Payer: Self-pay

## 2021-11-26 ENCOUNTER — Ambulatory Visit: Payer: Self-pay

## 2021-12-23 ENCOUNTER — Other Ambulatory Visit: Payer: Self-pay

## 2021-12-23 ENCOUNTER — Encounter: Payer: Self-pay | Admitting: Emergency Medicine

## 2021-12-23 DIAGNOSIS — Z20822 Contact with and (suspected) exposure to covid-19: Secondary | ICD-10-CM | POA: Insufficient documentation

## 2021-12-23 DIAGNOSIS — F17210 Nicotine dependence, cigarettes, uncomplicated: Secondary | ICD-10-CM | POA: Insufficient documentation

## 2021-12-23 DIAGNOSIS — M545 Low back pain, unspecified: Secondary | ICD-10-CM | POA: Insufficient documentation

## 2021-12-23 DIAGNOSIS — A598 Trichomoniasis of other sites: Secondary | ICD-10-CM | POA: Insufficient documentation

## 2021-12-23 DIAGNOSIS — R112 Nausea with vomiting, unspecified: Secondary | ICD-10-CM | POA: Insufficient documentation

## 2021-12-23 LAB — URINALYSIS, ROUTINE W REFLEX MICROSCOPIC
Bilirubin Urine: NEGATIVE
Glucose, UA: NEGATIVE mg/dL
Hgb urine dipstick: NEGATIVE
Ketones, ur: 15 mg/dL — AB
Leukocytes,Ua: NEGATIVE
Nitrite: NEGATIVE
Protein, ur: NEGATIVE mg/dL
Specific Gravity, Urine: >1.03 — ABNORMAL HIGH (ref 1.005–1.030)
pH: 6 (ref 5.0–8.0)

## 2021-12-23 LAB — RESP PANEL BY RT-PCR (FLU A&B, COVID) ARPGX2
Influenza A by PCR: NEGATIVE
Influenza B by PCR: NEGATIVE
SARS Coronavirus 2 by RT PCR: NEGATIVE

## 2021-12-23 LAB — POC URINE PREG, ED: Preg Test, Ur: NEGATIVE

## 2021-12-23 NOTE — ED Provider Notes (Signed)
Emergency Medicine Provider Triage Evaluation Note  Melanie Duran , a 30 y.o. female  was evaluated in triage.  Pt complains of vomiting x 2 mornings at 4am. Unable to tolerate food or fluids. Bilateral flank pain started a few weeks ago. She thought it was caused by BV, but is hurting more.  Review of Systems  Positive: Flank pain, vomiting Negative: Fever, cough  Physical Exam  There were no vitals taken for this visit. Gen:   Awake, no distress   Resp:  Normal effort  MSK:   Moves extremities without difficulty Other:   Medical Decision Making  Medically screening exam initiated at 4:54 PM.  Appropriate orders placed.  TANASHIA CIESLA was informed that the remainder of the evaluation will be completed by another provider, this initial triage assessment does not replace that evaluation, and the importance of remaining in the ED until their evaluation is complete.    Chinita Pester, FNP 12/23/21 1658    Minna Antis, MD 12/23/21 2322

## 2021-12-23 NOTE — ED Triage Notes (Signed)
Pt to ED via POV with c/o nausea for the last several days and bilat back pain that has been going on for "awhile" She reports that she gets BV a lot and was just going to go to her PMD but then she started having nausea.

## 2021-12-24 ENCOUNTER — Emergency Department
Admission: EM | Admit: 2021-12-24 | Discharge: 2021-12-24 | Disposition: A | Discharge status: Home / Self Care | Payer: Self-pay | Attending: Emergency Medicine | Admitting: Emergency Medicine

## 2021-12-24 DIAGNOSIS — A5901 Trichomonal vulvovaginitis: Secondary | ICD-10-CM

## 2021-12-24 LAB — CHLAMYDIA/NGC RT PCR (ARMC ONLY)
Chlamydia Tr: NOT DETECTED
N gonorrhoeae: NOT DETECTED

## 2021-12-24 LAB — WET PREP, GENITAL
Clue Cells Wet Prep HPF POC: NONE SEEN
Sperm: NONE SEEN
WBC, Wet Prep HPF POC: >=10 — AB (ref <10)
Yeast Wet Prep HPF POC: NONE SEEN

## 2021-12-24 MED ORDER — DOXYCYCLINE HYCLATE 100 MG PO TABS
100.0000 mg | ORAL_TABLET | Freq: Once | ORAL | Status: AC
  Administered 2021-12-24: 06:00:00 100 mg via ORAL
  Filled 2021-12-24: qty 1

## 2021-12-24 MED ORDER — METRONIDAZOLE 500 MG PO TABS
500.0000 mg | ORAL_TABLET | ORAL | Status: AC
  Administered 2021-12-24: 06:00:00 500 mg via ORAL
  Filled 2021-12-24: qty 1

## 2021-12-24 MED ORDER — CEFTRIAXONE SODIUM 1 G IJ SOLR
500.0000 mg | Freq: Once | INTRAMUSCULAR | Status: AC
  Administered 2021-12-24: 06:00:00 500 mg via INTRAMUSCULAR
  Filled 2021-12-24: qty 10

## 2021-12-24 MED ORDER — METRONIDAZOLE 500 MG PO TABS: 500.0000 mg | ORAL_TABLET | Freq: Two times a day (BID) | ORAL | 0 refills | Status: AC

## 2021-12-24 MED ORDER — ONDANSETRON 4 MG PO TBDP: ORAL_TABLET | ORAL | 0 refills | Status: AC

## 2021-12-24 MED ORDER — DOXYCYCLINE HYCLATE 100 MG PO CAPS: 100.0000 mg | ORAL_CAPSULE | Freq: Two times a day (BID) | ORAL | 0 refills | Status: AC
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
## 2021-12-24 NOTE — Discharge Instructions (Addendum)
As we discussed, your gonorrhea and Chlamydia tests are still pending, but since you tested positive for trichomoniasis, we discussed it and agreed to treat you for trichomoniasis, gonorrhea, and chlamydia.  Please fill the 2 prescriptions provided and take the full course of treatment for the next two weeks.  We also provided a prescription for nausea medicine in case the antibiotics make you feel sick to your stomach.  Please remember that although you are being treated for STDs at this time, you can still get them again if you have unprotected sex with an infected partner.  We recommend you follow-up with the [facility] department for additional symptoms or testing as needed.
